# Patient Record
Sex: Male | Born: 1970 | Race: White | Hispanic: No | Marital: Married | Smoking: Never smoker
Health system: Southern US, Community
[De-identification: ages and names within clinical notes are randomized; demographics above are authoritative.]

## PROBLEM LIST (undated history)

## (undated) HISTORY — PX: NECK SURGERY: SHX720

## (undated) HISTORY — PX: TENDON REPAIR: SHX5111

## (undated) HISTORY — PX: APPENDECTOMY: SHX54

---

## 2002-10-05 ENCOUNTER — Inpatient Hospital Stay (HOSPITAL_COMMUNITY): Admit: 2002-10-05 | Discharge: 2002-10-06 | Payer: Self-pay | Admitting: Neurosurgery

## 2017-01-19 DIAGNOSIS — Z6827 Body mass index (BMI) 27.0-27.9, adult: Secondary | ICD-10-CM | POA: Diagnosis not present

## 2017-01-19 DIAGNOSIS — K219 Gastro-esophageal reflux disease without esophagitis: Secondary | ICD-10-CM | POA: Diagnosis not present

## 2017-05-03 MED FILL — PANTOPRAZOLE SOD DR 40 MG T: 40 | 90 days supply | Qty: 90 | Fill #0

## 2017-08-04 DIAGNOSIS — L573 Poikiloderma of Civatte: Secondary | ICD-10-CM | POA: Diagnosis not present

## 2017-08-04 DIAGNOSIS — D485 Neoplasm of uncertain behavior of skin: Secondary | ICD-10-CM | POA: Diagnosis not present

## 2017-08-04 DIAGNOSIS — L570 Actinic keratosis: Secondary | ICD-10-CM | POA: Diagnosis not present

## 2017-12-17 DIAGNOSIS — Z0000 Encounter for general adult medical examination without abnormal findings: Secondary | ICD-10-CM | POA: Diagnosis not present

## 2017-12-22 DIAGNOSIS — Z0001 Encounter for general adult medical examination with abnormal findings: Secondary | ICD-10-CM | POA: Diagnosis not present

## 2017-12-22 DIAGNOSIS — K219 Gastro-esophageal reflux disease without esophagitis: Secondary | ICD-10-CM | POA: Diagnosis not present

## 2017-12-22 DIAGNOSIS — Z6828 Body mass index (BMI) 28.0-28.9, adult: Secondary | ICD-10-CM | POA: Diagnosis not present

## 2018-02-05 DIAGNOSIS — J0100 Acute maxillary sinusitis, unspecified: Secondary | ICD-10-CM | POA: Diagnosis not present

## 2018-02-05 DIAGNOSIS — Z6828 Body mass index (BMI) 28.0-28.9, adult: Secondary | ICD-10-CM | POA: Diagnosis not present

## 2018-03-15 DIAGNOSIS — Z23 Encounter for immunization: Secondary | ICD-10-CM | POA: Diagnosis not present

## 2018-08-03 DIAGNOSIS — D1801 Hemangioma of skin and subcutaneous tissue: Secondary | ICD-10-CM | POA: Diagnosis not present

## 2018-08-03 DIAGNOSIS — L819 Disorder of pigmentation, unspecified: Secondary | ICD-10-CM | POA: Diagnosis not present

## 2018-08-03 DIAGNOSIS — L570 Actinic keratosis: Secondary | ICD-10-CM | POA: Diagnosis not present

## 2018-08-12 DIAGNOSIS — Z6828 Body mass index (BMI) 28.0-28.9, adult: Secondary | ICD-10-CM | POA: Diagnosis not present

## 2018-08-12 DIAGNOSIS — J069 Acute upper respiratory infection, unspecified: Secondary | ICD-10-CM | POA: Diagnosis not present

## 2018-09-19 DIAGNOSIS — M545 Low back pain: Secondary | ICD-10-CM | POA: Diagnosis not present

## 2018-09-19 DIAGNOSIS — Z6829 Body mass index (BMI) 29.0-29.9, adult: Secondary | ICD-10-CM | POA: Diagnosis not present

## 2018-10-03 DIAGNOSIS — B029 Zoster without complications: Secondary | ICD-10-CM | POA: Diagnosis not present

## 2018-10-10 DIAGNOSIS — D696 Thrombocytopenia, unspecified: Secondary | ICD-10-CM | POA: Diagnosis not present

## 2018-12-26 DIAGNOSIS — Z0000 Encounter for general adult medical examination without abnormal findings: Secondary | ICD-10-CM | POA: Diagnosis not present

## 2018-12-30 DIAGNOSIS — E782 Mixed hyperlipidemia: Secondary | ICD-10-CM | POA: Diagnosis not present

## 2018-12-30 DIAGNOSIS — R7301 Impaired fasting glucose: Secondary | ICD-10-CM | POA: Diagnosis not present

## 2018-12-30 DIAGNOSIS — Z0001 Encounter for general adult medical examination with abnormal findings: Secondary | ICD-10-CM | POA: Diagnosis not present

## 2018-12-30 DIAGNOSIS — Z6828 Body mass index (BMI) 28.0-28.9, adult: Secondary | ICD-10-CM | POA: Diagnosis not present

## 2018-12-30 DIAGNOSIS — E8881 Metabolic syndrome: Secondary | ICD-10-CM | POA: Diagnosis not present

## 2018-12-30 DIAGNOSIS — K219 Gastro-esophageal reflux disease without esophagitis: Secondary | ICD-10-CM | POA: Diagnosis not present

## 2019-07-07 DIAGNOSIS — E782 Mixed hyperlipidemia: Secondary | ICD-10-CM | POA: Diagnosis not present

## 2019-07-07 DIAGNOSIS — E8881 Metabolic syndrome: Secondary | ICD-10-CM | POA: Diagnosis not present

## 2019-07-07 DIAGNOSIS — Z6828 Body mass index (BMI) 28.0-28.9, adult: Secondary | ICD-10-CM | POA: Diagnosis not present

## 2019-07-07 DIAGNOSIS — R7301 Impaired fasting glucose: Secondary | ICD-10-CM | POA: Diagnosis not present

## 2019-07-07 DIAGNOSIS — K219 Gastro-esophageal reflux disease without esophagitis: Secondary | ICD-10-CM | POA: Diagnosis not present

## 2020-04-01 ENCOUNTER — Other Ambulatory Visit: Payer: Self-pay

## 2020-04-01 ENCOUNTER — Emergency Department (HOSPITAL_COMMUNITY): Payer: 59

## 2020-04-01 ENCOUNTER — Encounter (HOSPITAL_COMMUNITY): Payer: Self-pay | Admitting: *Deleted

## 2020-04-01 ENCOUNTER — Emergency Department (HOSPITAL_COMMUNITY)
Admission: EM | Admit: 2020-04-01 | Discharge: 2020-04-02 | Disposition: A | Discharge status: Home / Self Care | Payer: 59 | Attending: Emergency Medicine | Admitting: Emergency Medicine

## 2020-04-01 DIAGNOSIS — D239 Other benign neoplasm of skin, unspecified: Secondary | ICD-10-CM | POA: Diagnosis not present

## 2020-04-01 DIAGNOSIS — K5792 Diverticulitis of intestine, part unspecified, without perforation or abscess without bleeding: Secondary | ICD-10-CM | POA: Insufficient documentation

## 2020-04-01 DIAGNOSIS — L570 Actinic keratosis: Secondary | ICD-10-CM | POA: Diagnosis not present

## 2020-04-01 DIAGNOSIS — K659 Peritonitis, unspecified: Secondary | ICD-10-CM | POA: Diagnosis not present

## 2020-04-01 DIAGNOSIS — R1032 Left lower quadrant pain: Secondary | ICD-10-CM | POA: Diagnosis present

## 2020-04-01 DIAGNOSIS — K760 Fatty (change of) liver, not elsewhere classified: Secondary | ICD-10-CM | POA: Diagnosis not present

## 2020-04-01 DIAGNOSIS — Z6827 Body mass index (BMI) 27.0-27.9, adult: Secondary | ICD-10-CM | POA: Diagnosis not present

## 2020-04-01 DIAGNOSIS — I781 Nevus, non-neoplastic: Secondary | ICD-10-CM | POA: Diagnosis not present

## 2020-04-01 LAB — URINALYSIS, ROUTINE W REFLEX MICROSCOPIC
Bilirubin Urine: NEGATIVE
Glucose, UA: NEGATIVE mg/dL
Hgb urine dipstick: NEGATIVE
Ketones, ur: NEGATIVE mg/dL
Leukocytes,Ua: NEGATIVE
Nitrite: NEGATIVE
Protein, ur: NEGATIVE mg/dL
Specific Gravity, Urine: 1.016 (ref 1.005–1.030)
pH: 7 (ref 5.0–8.0)

## 2020-04-01 LAB — COMPREHENSIVE METABOLIC PANEL
ALT: 37 U/L (ref 0–44)
AST: 29 U/L (ref 15–41)
Albumin: 4.4 g/dL (ref 3.5–5.0)
Alkaline Phosphatase: 73 U/L (ref 38–126)
Anion gap: 10 (ref 5–15)
BUN: 13 mg/dL (ref 6–20)
CO2: 26 mmol/L (ref 22–32)
Calcium: 9.6 mg/dL (ref 8.9–10.3)
Chloride: 100 mmol/L (ref 98–111)
Creatinine, Ser: 1 mg/dL (ref 0.61–1.24)
GFR calc Af Amer: >60 mL/min (ref >60)
GFR calc non Af Amer: >60 mL/min (ref >60)
Glucose, Bld: 111 mg/dL — ABNORMAL HIGH (ref 70–99)
Potassium: 4 mmol/L (ref 3.5–5.1)
Sodium: 136 mmol/L (ref 135–145)
Total Bilirubin: 1.5 mg/dL — ABNORMAL HIGH (ref 0.3–1.2)
Total Protein: 7.9 g/dL (ref 6.5–8.1)

## 2020-04-01 LAB — CBC
HCT: 48.9 % (ref 39.0–52.0)
Hemoglobin: 17 g/dL (ref 13.0–17.0)
MCH: 31.3 pg (ref 26.0–34.0)
MCHC: 34.8 g/dL (ref 30.0–36.0)
MCV: 89.9 fL (ref 80.0–100.0)
Platelets: 125 10*3/uL — ABNORMAL LOW (ref 150–400)
RBC: 5.44 MIL/uL (ref 4.22–5.81)
RDW: 12.6 % (ref 11.5–15.5)
WBC: 8.5 10*3/uL (ref 4.0–10.5)
nRBC: 0 % (ref 0.0–0.2)

## 2020-04-01 LAB — LIPASE, BLOOD: Lipase: 41 U/L (ref 11–51)

## 2020-04-01 MED ORDER — IOHEXOL 300 MG/ML  SOLN
100.0000 mL | Freq: Once | INTRAMUSCULAR | Status: AC | PRN
  Administered 2020-04-01: 100 mL via INTRAVENOUS

## 2020-04-01 MED ORDER — AMOXICILLIN-POT CLAVULANATE 875-125 MG PO TABS
1.0000 | ORAL_TABLET | Freq: Once | ORAL | Status: AC
  Administered 2020-04-01: 1 via ORAL
  Filled 2020-04-01: qty 1

## 2020-04-01 MED ORDER — MORPHINE SULFATE (PF) 4 MG/ML IV SOLN
4.0000 mg | Freq: Once | INTRAVENOUS | Status: AC
  Administered 2020-04-01: 4 mg via INTRAVENOUS
  Filled 2020-04-01: qty 1

## 2020-04-01 MED ORDER — ONDANSETRON HCL 4 MG/2ML IJ SOLN
4.0000 mg | Freq: Once | INTRAMUSCULAR | Status: AC
  Administered 2020-04-01: 4 mg via INTRAVENOUS
  Filled 2020-04-01: qty 2

## 2020-04-01 NOTE — ED Triage Notes (Signed)
Pt c/o umbilical pain x 3 days; pt has had diarrhea

## 2020-04-02 MED ORDER — OXYCODONE-ACETAMINOPHEN 5-325 MG PO TABS: 1.0000 | ORAL_TABLET | Freq: Four times a day (QID) | ORAL | 0 refills | Status: DC | PRN

## 2020-04-02 MED ORDER — ONDANSETRON 4 MG PO TBDP: 4.0000 mg | ORAL_TABLET | Freq: Three times a day (TID) | ORAL | 0 refills | Status: AC | PRN

## 2020-04-02 MED ORDER — AMOXICILLIN-POT CLAVULANATE 875-125 MG PO TABS: 1.0000 | ORAL_TABLET | Freq: Two times a day (BID) | ORAL | 0 refills | Status: DC

## 2020-04-02 NOTE — Discharge Instructions (Addendum)
You were seen today and found to have diverticulitis.  Take antibiotic as prescribed.  You may take pain and nausea medication.  Do not drive while taking pain medication.  If you develop fevers, worsening pain, any new or worsening symptoms you should be reevaluated.

## 2020-04-02 NOTE — ED Provider Notes (Signed)
[facility] EMERGENCY DEPARTMENT Provider Note   CSN: [id number] Arrival date & time: 04/01/20  1951     History Chief Complaint  Patient presents with  . Abdominal Pain    Gregory Crawford is a 49 y.o. male.  HPI     This is a 49 year old male with a history of appendectomy who presents with abdominal pain.  Patient reports he started feeling poorly on Friday.  He had progressive pain over the weekend in his lower abdomen left lower quadrant.  No noted fevers.  He states he has had some diarrhea.  No bloody stools.  He reports nausea without vomiting.  He was seen and evaluated by his primary physician today and have some concern for diverticulitis.  He denies any other systemic symptoms including chest pain, shortness of breath.  No known sick contacts or Covid exposures.  History reviewed. No pertinent past medical history.  There are no problems to display for this patient.   Past Surgical History:  Procedure Laterality Date  . APPENDECTOMY    . TENDON REPAIR Left        History reviewed. No pertinent family history.  Social History   Tobacco Use  . Smoking status: Never Smoker  . Smokeless tobacco: Never Used  Vaping Use  . Vaping Use: Never used  Substance Use Topics  . Alcohol use: Yes    Comment: occasionally  . Drug use: Not Currently    Home Medications Prior to Admission medications   Not on File    Allergies    Patient has no known allergies.  Review of Systems   Review of Systems  Constitutional: Negative for fever.  Respiratory: Negative for shortness of breath.   Cardiovascular: Negative for chest pain.  Gastrointestinal: Positive for abdominal pain, diarrhea and nausea. Negative for blood in stool, constipation and vomiting.  Genitourinary: Negative for dysuria.  All other systems reviewed and are negative.   Physical Exam Updated Vital Signs BP (!) 150/99   Pulse 67   Temp 99 F (37.2 C) (Oral)   Resp 16   Ht 1.829 m (6')    Wt 95.3 kg   SpO2 90%   BMI 28.48 kg/m   Physical Exam Vitals and nursing note reviewed.  Constitutional:      Appearance: He is well-developed. He is not ill-appearing.  HENT:     Head: Normocephalic and atraumatic.     Mouth/Throat:     Mouth: Mucous membranes are moist.  Eyes:     Pupils: Pupils are equal, round, and reactive to light.  Cardiovascular:     Rate and Rhythm: Normal rate and regular rhythm.     Heart sounds: Normal heart sounds. No murmur heard.   Pulmonary:     Effort: Pulmonary effort is normal. No respiratory distress.     Breath sounds: Normal breath sounds. No wheezing.  Abdominal:     General: Bowel sounds are normal.     Palpations: Abdomen is soft.     Tenderness: There is abdominal tenderness in the left lower quadrant. There is no guarding or rebound.  Musculoskeletal:     Cervical back: Neck supple.  Lymphadenopathy:     Cervical: No cervical adenopathy.  Skin:    General: Skin is warm and dry.  Neurological:     Mental Status: He is alert and oriented to person, place, and time.  Psychiatric:        Mood and Affect: Mood normal.     ED Results /  Procedures / Treatments   Labs (all labs ordered are listed, but only abnormal results are displayed) Labs Reviewed  COMPREHENSIVE METABOLIC PANEL - Abnormal; Notable for the following components:      Result Value   Glucose, Bld 111 (*)    Total Bilirubin 1.5 (*)    All other components within normal limits  CBC - Abnormal; Notable for the following components:   Platelets 125 (*)    All other components within normal limits  LIPASE, BLOOD  URINALYSIS, ROUTINE W REFLEX MICROSCOPIC    EKG None  Radiology CT Abdomen Pelvis W Contrast  Result Date: 04/01/2020 CLINICAL DATA:  Diverticulitis. EXAM: CT ABDOMEN AND PELVIS WITH CONTRAST TECHNIQUE: Multidetector CT imaging of the abdomen and pelvis was performed using the standard protocol following bolus administration of intravenous  contrast. CONTRAST:  120mL OMNIPAQUE IOHEXOL 300 MG/ML  SOLN COMPARISON:  None. FINDINGS: Lower chest: The lung bases are clear. The heart size is normal. Hepatobiliary: There is decreased hepatic attenuation suggestive of hepatic steatosis. Normal gallbladder.There is no biliary ductal dilation. Pancreas: Normal contours without ductal dilatation. No peripancreatic fluid collection. Spleen: Unremarkable. Adrenals/Urinary Tract: --Adrenal glands: Unremarkable. --Right kidney/ureter: No hydronephrosis or radiopaque kidney stones. --Left kidney/ureter: No hydronephrosis or radiopaque kidney stones. --Urinary bladder: Unremarkable. Stomach/Bowel: --Stomach/Duodenum: No hiatal hernia or other gastric abnormality. Normal duodenal course and caliber. --Small bowel: Unremarkable. --Colon: There is a single diverticulum at the level of the sigmoid colon with surrounding inflammatory changes and adjacent circumferential wall thickening of the affected sigmoid colon. There is no adjacent fluid collection or free air. There is liquid stool within the right hemicolon. --Appendix: Not visualized. No right lower quadrant inflammation or free fluid. Vascular/Lymphatic: Normal course and caliber of the major abdominal vessels. --No retroperitoneal lymphadenopathy. --No mesenteric lymphadenopathy. --No pelvic or inguinal lymphadenopathy. Reproductive: Unremarkable Other: No ascites or free air. The abdominal wall is normal. Musculoskeletal. No acute displaced fractures. IMPRESSION: 1. Acute uncomplicated sigmoid diverticulitis. 2. Hepatic steatosis. Electronically Signed   By: Constance Holster M.D.   On: 04/01/2020 23:31    Procedures Procedures (including critical care time)  Medications Ordered in ED Medications  iohexol (OMNIPAQUE) 300 MG/ML solution 100 mL (100 mLs Intravenous Contrast Given 04/01/20 2313)  morphine 4 MG/ML injection 4 mg (4 mg Intravenous Given 04/01/20 2346)  ondansetron (ZOFRAN) injection 4 mg (4 mg  Intravenous Given 04/01/20 2347)  amoxicillin-clavulanate (AUGMENTIN) 875-125 MG per tablet 1 tablet (1 tablet Oral Given 04/01/20 2344)    ED Course  I have reviewed the triage vital signs and the nursing notes.  Pertinent labs & imaging results that were available during my care of the patient were reviewed by me and considered in my medical decision making (see chart for details).    MDM Rules/Calculators/A&P                          Patient presents with abdominal pain.  Overall nontoxic.  He is afebrile.  Slightly hypertensive at 150/99.  Sent from primary office with concern for diverticulitis.  He is clinically non-ill-appearing.  He does have some tenderness in left lower quadrant.  CT scan ordered from triage reviewed and shows evidence of uncomplicated diverticulitis.  Patient was given pain and nausea medication as well as Augmentin.  Lab work reviewed.  No significant metabolic derangements.  No significant leukocytosis.  Patient is able to orally hydrate.  Feel he is candidate for outpatient treatment.  Will discharge with a short  course of pain medication and Augmentin.  He was given strict return precautions.  After history, exam, and medical workup I feel the patient has been appropriately medically screened and is safe for discharge home. Pertinent diagnoses were discussed with the patient. Patient was given return precautions.   Final Clinical Impression(s) / ED Diagnoses Final diagnoses:  Diverticulitis    Rx / DC Orders ED Discharge Orders    None       Cashis Rill, Barbette Hair, MD 04/02/20 0100

## 2020-04-15 DIAGNOSIS — K5792 Diverticulitis of intestine, part unspecified, without perforation or abscess without bleeding: Secondary | ICD-10-CM | POA: Diagnosis not present

## 2020-04-15 DIAGNOSIS — Z6828 Body mass index (BMI) 28.0-28.9, adult: Secondary | ICD-10-CM | POA: Diagnosis not present

## 2020-04-17 ENCOUNTER — Encounter (INDEPENDENT_AMBULATORY_CARE_PROVIDER_SITE_OTHER): Payer: Self-pay | Admitting: *Deleted

## 2020-05-09 ENCOUNTER — Encounter (INDEPENDENT_AMBULATORY_CARE_PROVIDER_SITE_OTHER): Payer: Self-pay

## 2020-05-09 ENCOUNTER — Other Ambulatory Visit (HOSPITAL_COMMUNITY): Payer: Self-pay | Admitting: Gastroenterology

## 2020-05-09 ENCOUNTER — Telehealth (INDEPENDENT_AMBULATORY_CARE_PROVIDER_SITE_OTHER): Payer: Self-pay

## 2020-05-09 ENCOUNTER — Other Ambulatory Visit (INDEPENDENT_AMBULATORY_CARE_PROVIDER_SITE_OTHER): Payer: Self-pay

## 2020-05-09 DIAGNOSIS — Z1211 Encounter for screening for malignant neoplasm of colon: Secondary | ICD-10-CM

## 2020-05-09 MED ORDER — NA SULFATE-K SULFATE-MG SULF 17.5-3.13-1.6 GM/177ML PO SOLN: 1.0000 | Freq: Once | ORAL | 0 refills | Status: AC

## 2020-05-09 MED FILL — SUPREP BOWEL PREP KIT: 17.5-3.13-1 | 1 days supply | Qty: 354 | Fill #0

## 2020-05-09 NOTE — Telephone Encounter (Signed)
Referring MD/PCP: Quillian Quince   Procedure: Tcs w/mac  Reason/Indication:  Screening  Has patient had this procedure before?  no  If so, when, by whom and where?    Is there a family history of colon cancer?  no  Who?  What age when diagnosed?    Is patient diabetic?   no      Does patient have prosthetic heart valve or mechanical valve?  no  Do you have a pacemaker/defibrillator?  no  Has patient ever had endocarditis/atrial fibrillation? no  Does patient use oxygen? no  Has patient had joint replacement within last 12 months?  no  Is patient constipated or do they take laxatives? no  Does patient have a history of alcohol/drug use?  no  Is patient on blood thinner such as Coumadin, Plavix and/or Aspirin? no  Medications: none  Allergies: nkda  Medication Adjustment per Dr Jenetta Downer none  Procedure date & time: 05/15/20 8:15

## 2020-05-09 NOTE — Telephone Encounter (Signed)
Ok to schedule.  Thanks,  Mel Langan Castaneda Mayorga, MD Gastroenterology and Hepatology [facility]  

## 2020-05-09 NOTE — Telephone Encounter (Signed)
Bowel prep

## 2020-05-13 ENCOUNTER — Other Ambulatory Visit: Payer: Self-pay

## 2020-05-13 ENCOUNTER — Other Ambulatory Visit (HOSPITAL_COMMUNITY)
Admission: RE | Admit: 2020-05-13 | Discharge: 2020-05-13 | Disposition: A | Discharge status: Home / Self Care | Payer: 59 | Source: Ambulatory Visit | Attending: Gastroenterology | Admitting: Gastroenterology

## 2020-05-13 DIAGNOSIS — Z01812 Encounter for preprocedural laboratory examination: Secondary | ICD-10-CM | POA: Insufficient documentation

## 2020-05-13 DIAGNOSIS — Z20822 Contact with and (suspected) exposure to covid-19: Secondary | ICD-10-CM | POA: Insufficient documentation

## 2020-05-13 LAB — SARS CORONAVIRUS 2 (TAT 6-24 HRS): SARS Coronavirus 2: NEGATIVE

## 2020-05-15 ENCOUNTER — Other Ambulatory Visit: Payer: Self-pay

## 2020-05-15 ENCOUNTER — Ambulatory Visit (HOSPITAL_COMMUNITY): Payer: 59 | Admitting: Anesthesiology

## 2020-05-15 ENCOUNTER — Encounter (HOSPITAL_COMMUNITY): Payer: Self-pay | Admitting: Gastroenterology

## 2020-05-15 ENCOUNTER — Encounter (HOSPITAL_COMMUNITY)
Admission: RE | Disposition: A | Discharge status: Home / Self Care | Payer: Self-pay | Source: Home / Self Care | Attending: Gastroenterology

## 2020-05-15 ENCOUNTER — Ambulatory Visit (HOSPITAL_COMMUNITY)
Admission: RE | Admit: 2020-05-15 | Discharge: 2020-05-15 | Disposition: A | Discharge status: Home / Self Care | Payer: 59 | Attending: Gastroenterology | Admitting: Gastroenterology

## 2020-05-15 DIAGNOSIS — K648 Other hemorrhoids: Secondary | ICD-10-CM | POA: Diagnosis not present

## 2020-05-15 DIAGNOSIS — D125 Benign neoplasm of sigmoid colon: Secondary | ICD-10-CM

## 2020-05-15 DIAGNOSIS — K635 Polyp of colon: Secondary | ICD-10-CM | POA: Insufficient documentation

## 2020-05-15 DIAGNOSIS — Z1211 Encounter for screening for malignant neoplasm of colon: Secondary | ICD-10-CM | POA: Diagnosis not present

## 2020-05-15 DIAGNOSIS — K5730 Diverticulosis of large intestine without perforation or abscess without bleeding: Secondary | ICD-10-CM | POA: Insufficient documentation

## 2020-05-15 HISTORY — PX: COLONOSCOPY WITH PROPOFOL: SHX5780

## 2020-05-15 HISTORY — PX: POLYPECTOMY: SHX5525

## 2020-05-15 LAB — HM COLONOSCOPY

## 2020-05-15 SURGERY — COLONOSCOPY WITH PROPOFOL
Anesthesia: General

## 2020-05-15 MED ORDER — PROPOFOL 10 MG/ML IV BOLUS
INTRAVENOUS | Status: DC | PRN
  Administered 2020-05-15: 100 mg via INTRAVENOUS

## 2020-05-15 MED ORDER — PROPOFOL 500 MG/50ML IV EMUL
INTRAVENOUS | Status: DC | PRN
  Administered 2020-05-15: 150 ug/kg/min via INTRAVENOUS

## 2020-05-15 MED ORDER — LACTATED RINGERS IV SOLN: Freq: Once | INTRAVENOUS | Status: AC

## 2020-05-15 MED ORDER — CHLORHEXIDINE GLUCONATE CLOTH 2 % EX PADS: 6.0000 | MEDICATED_PAD | Freq: Once | CUTANEOUS | Status: DC

## 2020-05-15 MED ORDER — LACTATED RINGERS IV SOLN: INTRAVENOUS | Status: DC | PRN

## 2020-05-15 MED ORDER — PROPOFOL 10 MG/ML IV BOLUS
INTRAVENOUS | Status: AC
  Filled 2020-05-15: qty 80

## 2020-05-15 NOTE — H&P (Signed)
Vrishank Moster is an 49 y.o. male.   Chief Complaint: Screening colonoscopy HPI: 49 year old male with past medical history of recent diverticulitis 6 weeks ago, who comes to the hospital to undergo screening colonoscopy.  The patient has never had a colonoscopy in the past.  She denies having any complaints such as melena, hematochezia,  or distention, change in her bowel movement consistency or frequency, no changes in her weight recently.  No family history of colorectal cancer.  Notably, the patient was diagnosed with acute diverticulitis on 04/01/2020 for which he received a course of Augmentin with resolution of his symptoms.  He currently endorses having occasional right sided abdominal pain but denies having any other complaints.  History reviewed. No pertinent past medical history.  Past Surgical History:  Procedure Laterality Date  . APPENDECTOMY    . NECK SURGERY    . TENDON REPAIR Left     History reviewed. No pertinent family history. Social History:  reports that he has never smoked. He has never used smokeless tobacco. He reports current alcohol use. He reports previous drug use.  Allergies: No Known Allergies  Medications Prior to Admission  Medication Sig Dispense Refill  . acetaminophen (TYLENOL) 500 MG tablet Take 1,000 mg by mouth every 6 (six) hours as needed for moderate pain or headache.    . ibuprofen (ADVIL) 200 MG tablet Take 400-800 mg by mouth every 6 (six) hours as needed for headache or moderate pain.    . Multiple Vitamin (MULTIVITAMIN WITH MINERALS) TABS tablet Take 1 tablet by mouth daily.    Marland Kitchen amoxicillin-clavulanate (AUGMENTIN) 875-125 MG tablet Take 1 tablet by mouth every 12 (twelve) hours. (Patient not taking: Reported on 05/09/2020) 20 tablet 0  . ondansetron (ZOFRAN ODT) 4 MG disintegrating tablet Take 1 tablet (4 mg total) by mouth every 8 (eight) hours as needed for nausea or vomiting. (Patient not taking: Reported on 05/09/2020) 20 tablet 0  .  oxyCODONE-acetaminophen (PERCOCET/ROXICET) 5-325 MG tablet Take 1 tablet by mouth every 6 (six) hours as needed for severe pain. (Patient not taking: Reported on 05/09/2020) 15 tablet 0    Results for orders placed or performed during the hospital encounter of 05/13/20 (from the past 48 hour(s))  SARS CORONAVIRUS 2 (TAT 6-24 HRS) Nasopharyngeal Nasopharyngeal Swab     Status: None   Collection Time: 05/13/20  8:35 AM   Specimen: Nasopharyngeal Swab  Result Value Ref Range   SARS Coronavirus 2 NEGATIVE NEGATIVE    Comment: (NOTE) SARS-CoV-2 target nucleic acids are NOT DETECTED.  The SARS-CoV-2 RNA is generally detectable in upper and lower respiratory specimens during the acute phase of infection. Negative results do not preclude SARS-CoV-2 infection, do not rule out co-infections with other pathogens, and should not be used as the sole basis for treatment or other patient management decisions. Negative results must be combined with clinical observations, patient history, and epidemiological information. The expected result is Negative.  Fact Sheet for Patients: SugarRoll.be  Fact Sheet for Healthcare Providers: https://www.woods-mathews.com/  This test is not yet approved or cleared by the [place] FDA and  has been authorized for detection and/or diagnosis of SARS-CoV-2 by FDA under an Emergency Use Authorization (EUA). This EUA will remain  in effect (meaning this test can be used) for the duration of the COVID-19 declaration under Se ction 564(b)(1) of the Act, 21 U.S.C. section 360bbb-3(b)(1), unless the authorization is terminated or revoked sooner.  Performed at [facility], [place],   [address]    No results found.  Review of Systems  Constitutional: Negative.   HENT: Negative.   Eyes: Negative.   Respiratory: Negative.   Cardiovascular: Negative.   Gastrointestinal: Positive for abdominal  pain.  Endocrine: Negative.   Genitourinary: Negative.   Musculoskeletal: Negative.   Skin: Negative.   Allergic/Immunologic: Negative.   Neurological: Negative.   Hematological: Negative.   Psychiatric/Behavioral: Negative.     Blood pressure (!) 145/99, pulse 62, temperature 98.1 F (36.7 C), temperature source Oral, resp. rate 17, SpO2 96 %. Physical Exam  GENERAL: The patient is AO x3, in no acute distress. HEENT: Head is normocephalic and atraumatic. EOMI are intact. Mouth is well hydrated and without lesions. NECK: Supple. No masses LUNGS: Clear to auscultation. No presence of rhonchi/wheezing/rales. Adequate chest expansion HEART: RRR, normal s1 and s2. ABDOMEN: mildly tender upon palpation of R flank, no guarding, no peritoneal signs, and nondistended. BS +. No masses. EXTREMITIES: Without any cyanosis, clubbing, rash, lesions or edema. NEUROLOGIC: AOx3, no focal motor deficit. SKIN: no jaundice, no rashes  Assessment/Plan 49 year old male with past medical history of recent diverticulitis 6 weeks ago, who comes to the hospital to undergo screening colonoscopy.  Patient is at average risk for colorectal cancer but had recent episode of diverticulitis treated adequately with antibiotics.  We will proceed with colonoscopy today but patient was advised that if there is ongoing inflammation the procedure will be aborted. Patient understood and agreed.   Harvel Quale, MD 05/15/2020, 7:31 AM

## 2020-05-15 NOTE — Transfer of Care (Signed)
Immediate Anesthesia Transfer of Care Note  Patient: Gregory Crawford  Procedure(s) Performed: COLONOSCOPY WITH PROPOFOL (N/A ) POLYPECTOMY  Patient Location: PACU  Anesthesia Type:General  Level of Consciousness: awake, alert , oriented and patient cooperative  Airway & Oxygen Therapy: Patient Spontanous Breathing  Post-op Assessment: Report given to RN, Post -op Vital signs reviewed and stable and Patient moving all extremities X 4  Post vital signs: Reviewed and stable  Last Vitals:  Vitals Value Taken Time  BP    Temp    Pulse    Resp    SpO2      Last Pain:  Vitals:   05/15/20 0806  TempSrc:   PainSc: 0-No pain      Patients Stated Pain Goal: 7 (29/56/21 3086)  Complications: No complications documented.

## 2020-05-15 NOTE — Anesthesia Preprocedure Evaluation (Addendum)
Anesthesia Evaluation  Patient identified by MRN, date of birth, ID band Patient awake    Reviewed: Allergy & Precautions, NPO status , Patient's Chart, lab work & pertinent test results  History of Anesthesia Complications Negative for: history of anesthetic complications  Airway Mallampati: III  TM Distance: >3 FB Neck ROM: Full    Dental  (+) Dental Advisory Given, Teeth Intact   Pulmonary neg pulmonary ROS,    Pulmonary exam normal breath sounds clear to auscultation       Cardiovascular Exercise Tolerance: Good Normal cardiovascular exam Rhythm:Regular Rate:Normal     Neuro/Psych negative neurological ROS  negative psych ROS   GI/Hepatic negative GI ROS, Neg liver ROS,   Endo/Other  negative endocrine ROS  Renal/GU negative Renal ROS  negative genitourinary   Musculoskeletal negative musculoskeletal ROS (+)   Abdominal   Peds  Hematology negative hematology ROS (+)   Anesthesia Other Findings   Reproductive/Obstetrics negative OB ROS                            Anesthesia Physical Anesthesia Plan  ASA: I  Anesthesia Plan: General   Post-op Pain Management:    Induction: Intravenous  PONV Risk Score and Plan: TIVA  Airway Management Planned: Nasal Cannula, Natural Airway and Simple Face Mask  Additional Equipment:   Intra-op Plan:   Post-operative Plan:   Informed Consent: I have reviewed the patients History and Physical, chart, labs and discussed the procedure including the risks, benefits and alternatives for the proposed anesthesia with the patient or authorized representative who has indicated his/her understanding and acceptance.     Dental advisory given  Plan Discussed with: CRNA and Surgeon  Anesthesia Plan Comments:        Anesthesia Quick Evaluation

## 2020-05-15 NOTE — Anesthesia Postprocedure Evaluation (Signed)
Anesthesia Post Note  Patient: Gregory Crawford  Procedure(s) Performed: COLONOSCOPY WITH PROPOFOL (N/A ) POLYPECTOMY  Patient location during evaluation: Phase II Anesthesia Type: General Level of consciousness: awake, oriented, awake and alert and patient cooperative Pain management: satisfactory to patient Vital Signs Assessment: post-procedure vital signs reviewed and stable Respiratory status: spontaneous breathing, respiratory function stable and nonlabored ventilation Cardiovascular status: stable Postop Assessment: no apparent nausea or vomiting Anesthetic complications: no   No complications documented.   Last Vitals:  Vitals:   05/15/20 0718 05/15/20 0845  BP: (!) 145/99 104/64  Pulse: 62   Resp: 17 18  Temp: 36.7 C (!) 36.3 C  SpO2: 96% 93%    Last Pain:  Vitals:   05/15/20 0845  TempSrc: Axillary  PainSc:                  Willa Rough

## 2020-05-15 NOTE — Op Note (Signed)
[facility] Patient Name: Gregory Crawford Procedure Date: 05/15/2020 7:58 AM MRN: [id number] Date of Birth: May 01, 1971 Attending MD: Maylon Peppers ,  CSN: [id number] Age: 49 Admit Type: Outpatient Procedure:                Colonoscopy Indications:              Screening for colorectal malignant neoplasm Providers:                Maylon Peppers, [place] Sharon Seller, RN, Dereck Leep, Technician, Nelma Rothman, Technician Referring MD:              Medicines:                Monitored Anesthesia Care Complications:            No immediate complications. Estimated Blood Loss:     Estimated blood loss: none. Procedure:                Pre-Anesthesia Assessment:                           - Prior to the procedure, a History and Physical                            was performed, and patient medications, allergies                            and sensitivities were reviewed. The patient's                            tolerance of previous anesthesia was reviewed.                           - The risks and benefits of the procedure and the                            sedation options and risks were discussed with the                            patient. All questions were answered and informed                            consent was obtained.                           - ASA Grade Assessment: II - A patient with mild                            systemic disease.                           After obtaining informed consent, the colonoscope                            was passed under direct  vision. Throughout the                            procedure, the patient's blood pressure, pulse, and                            oxygen saturations were monitored continuously. The                            [id number]-[id number] ([id number]) scope was introduced through                            the anus and advanced to the the cecum, identified                            by appendiceal  orifice and ileocecal valve. The                            colonoscopy was performed without difficulty. The                            patient tolerated the procedure well. Scope In: 8:11:21 AM Scope Out: 8:40:44 AM Scope Withdrawal Time: 0 hours 19 minutes 0 seconds  Total Procedure Duration: 0 hours 29 minutes 23 seconds  Findings:      The perianal and digital rectal examinations were normal.      A few small and large-mouthed diverticula were found in the sigmoid       colon, descending colon and ascending colon.      A 2 mm polyp was found in the sigmoid colon. The polyp was sessile. The       polyp was removed with a cold biopsy forceps. Resection and retrieval       were complete.      Non-bleeding internal hemorrhoids were found during retroflexion. The       hemorrhoids were small. Impression:               - Diverticulosis in the sigmoid colon, in the                            descending colon and in the ascending colon.                           - One 2 mm polyp in the sigmoid colon, removed with                            a cold biopsy forceps. Resected and retrieved.                           - Non-bleeding internal hemorrhoids. Moderate Sedation:      Per Anesthesia Care Recommendation:           - Discharge patient to home (ambulatory).                           - High fiber diet.                           -  Repeat colonoscopy date to be determined after                            pending pathology results are reviewed for                            screening purposes.                           - Await pathology results. Procedure Code(s):        --- Professional ---                           [phone], GC, Colonoscopy, flexible; with biopsy,                            single or multiple Diagnosis Code(s):        --- Professional ---                           Z12.11, Encounter for screening for malignant                            neoplasm of colon                            K64.8, Other hemorrhoids                           K63.5, Polyp of colon                           K57.30, Diverticulosis of large intestine without                            perforation or abscess without bleeding CPT copyright 2019 American Medical Association. All rights reserved. The codes documented in this report are preliminary and upon coder review may  be revised to meet current compliance requirements. Maylon Peppers, MD Maylon Peppers,  05/15/2020 8:47:34 AM This report has been signed electronically. Number of Addenda: 0

## 2020-05-15 NOTE — Discharge Instructions (Signed)
You are being discharged to home.  Eat a high fiber diet.  Your physician has recommended a repeat colonoscopy (date to be determined after pending pathology results are reviewed) for screening purposes.  We are waiting for your pathology results.    Diverticulosis  Diverticulosis is a condition that develops when small pouches (diverticula) form in the wall of the large intestine (colon). The colon is where water is absorbed and stool (feces) is formed. The pouches form when the inside layer of the colon pushes through weak spots in the outer layers of the colon. You may have a few pouches or many of them. The pouches usually do not cause problems unless they become inflamed or infected. When this happens, the condition is called diverticulitis. What are the causes? The cause of this condition is not known. What increases the risk? The following factors may make you more likely to develop this condition:  Being older than age 73. Your risk for this condition increases with age. Diverticulosis is rare among people younger than age 73. By age 92, many people have it.  Eating a low-fiber diet.  Having frequent constipation.  Being overweight.  Not getting enough exercise.  Smoking.  Taking over-the-counter pain medicines, like aspirin and ibuprofen.  Having a family history of diverticulosis. What are the signs or symptoms? In most people, there are no symptoms of this condition. If you do have symptoms, they may include:  Bloating.  Cramps in the abdomen.  Constipation or diarrhea.  Pain in the lower left side of the abdomen. How is this diagnosed? Because diverticulosis usually has no symptoms, it is most often diagnosed during an exam for other colon problems. The condition may be diagnosed by:  Using a flexible scope to examine the colon (colonoscopy).  Taking an X-ray of the colon after dye has been put into the colon (barium enema).  Having a CT scan. How is this  treated? You may not need treatment for this condition. Your health care provider may recommend treatment to prevent problems. You may need treatment if you have symptoms or if you previously had diverticulitis. Treatment may include:  Eating a high-fiber diet.  Taking a fiber supplement.  Taking a live bacteria supplement (probiotic).  Taking medicine to relax your colon. Follow these instructions at home: Medicines  Take over-the-counter and prescription medicines only as told by your health care provider.  If told by your health care provider, take a fiber supplement or probiotic. Constipation prevention Your condition may cause constipation. To prevent or treat constipation, you may need to:  Drink enough fluid to keep your urine pale yellow.  Take over-the-counter or prescription medicines.  Eat foods that are high in fiber, such as beans, whole grains, and fresh fruits and vegetables.  Limit foods that are high in fat and processed sugars, such as fried or sweet foods.  General instructions  Try not to strain when you have a bowel movement.  Keep all follow-up visits as told by your health care provider. This is important. Contact a health care provider if you:  Have pain in your abdomen.  Have bloating.  Have cramps.  Have not had a bowel movement in 3 days. Get help right away if:  Your pain gets worse.  Your bloating becomes very bad.  You have a fever or chills, and your symptoms suddenly get worse.  You vomit.  You have bowel movements that are bloody or black.  You have bleeding from your rectum. Summary  Diverticulosis is a condition that develops when small pouches (diverticula) form in the wall of the large intestine (colon).  You may have a few pouches or many of them.  This condition is most often diagnosed during an exam for other colon problems.  Treatment may include increasing the fiber in your diet, taking supplements, or taking  medicines. This information is not intended to replace advice given to you by your health care provider. Make sure you discuss any questions you have with your health care provider. Document Revised: 01/12/2019 Document Reviewed: 01/12/2019 Elsevier Patient Education  [place].   Colon Polyps  Polyps are tissue growths inside the body. Polyps can grow in many places, including the large intestine (colon). A polyp may be a round bump or a mushroom-shaped growth. You could have one polyp or several. Most colon polyps are noncancerous (benign). However, some colon polyps can become cancerous over time. Finding and removing the polyps early can help prevent this. What are the causes? The exact cause of colon polyps is not known. What increases the risk? You are more likely to develop this condition if you:  Have a family history of colon cancer or colon polyps.  Are older than 66 or older than 45 if you are African American.  Have inflammatory bowel disease, such as ulcerative colitis or Crohn's disease.  Have certain hereditary conditions, such as: ? Familial adenomatous polyposis. ? Lynch syndrome. ? Turcot syndrome. ? Peutz-Jeghers syndrome.  Are overweight.  Smoke cigarettes.  Do not get enough exercise.  Drink too much alcohol.  Eat a diet that is high in fat and red meat and low in fiber.  Had childhood cancer that was treated with abdominal radiation. What are the signs or symptoms? Most polyps do not cause symptoms. If you have symptoms, they may include:  Blood coming from your rectum when having a bowel movement.  Blood in your stool. The stool may look dark red or black.  Abdominal pain.  A change in bowel habits, such as constipation or diarrhea. How is this diagnosed? This condition is diagnosed with a colonoscopy. This is a procedure in which a lighted, flexible scope is inserted into the anus and then passed into the colon to examine the area.  Polyps are sometimes found when a colonoscopy is done as part of routine cancer screening tests. How is this treated? Treatment for this condition involves removing any polyps that are found. Most polyps can be removed during a colonoscopy. Those polyps will then be tested for cancer. Additional treatment may be needed depending on the results of testing. Follow these instructions at home: Lifestyle  Maintain a healthy weight, or lose weight if recommended by your health care provider.  Exercise every day or as told by your health care provider.  Do not use any products that contain nicotine or tobacco, such as cigarettes and e-cigarettes. If you need help quitting, ask your health care provider.  If you drink alcohol, limit how much you have: ? 0-1 drink a day for women. ? 0-2 drinks a day for men.  Be aware of how much alcohol is in your drink. In the [place] one drink equals one 12 oz bottle of beer (355 mL), one 5 oz glass of wine (148 mL), or one 1 oz shot of hard liquor (44 mL). Eating and drinking   Eat foods that are high in fiber, such as fruits, vegetables, and whole grains.  Eat foods that are high in calcium  and vitamin D, such as milk, cheese, yogurt, eggs, liver, fish, and broccoli.  Limit foods that are high in fat, such as fried foods and desserts.  Limit the amount of red meat and processed meat you eat, such as hot dogs, sausage, bacon, and lunch meats. General instructions  Keep all follow-up visits as told by your health care provider. This is important. ? This includes having regularly scheduled colonoscopies. ? Talk to your health care provider about when you need a colonoscopy. Contact a health care provider if:  You have new or worsening bleeding during a bowel movement.  You have new or increased blood in your stool.  You have a change in bowel habits.  You lose weight for no known reason. Summary  Polyps are tissue growths inside the body. Polyps  can grow in many places, including the colon.  Most colon polyps are noncancerous (benign), but some can become cancerous over time.  This condition is diagnosed with a colonoscopy.  Treatment for this condition involves removing any polyps that are found. Most polyps can be removed during a colonoscopy. This information is not intended to replace advice given to you by your health care provider. Make sure you discuss any questions you have with your health care provider. Document Revised: 09/30/2017 Document Reviewed: 09/30/2017 Elsevier Patient Education  [place].  Colonoscopy, Adult, Care After This sheet gives you information about how to care for yourself after your procedure. Your doctor may also give you more specific instructions. If you have problems or questions, call your doctor. What can I expect after the procedure? After the procedure, it is common to have:  A small amount of blood in your poop (stool) for 24 hours.  Some gas.  Mild cramping or bloating in your belly (abdomen). Follow these instructions at home: Eating and drinking   Drink enough fluid to keep your pee (urine) pale yellow.  Follow instructions from your doctor about what you cannot eat or drink.  Return to your normal diet as told by your doctor. Avoid heavy or fried foods that are hard to digest. Activity  Rest as told by your doctor.  Do not sit for a long time without moving. Get up to take short walks every 1-2 hours. This is important. Ask for help if you feel weak or unsteady.  Return to your normal activities as told by your doctor. Ask your doctor what activities are safe for you. To help cramping and bloating:   Try walking around.  Put heat on your belly as told by your doctor. Use the heat source that your doctor recommends, such as a moist heat pack or a heating pad. ? Put a towel between your skin and the heat source. ? Leave the heat on for 20-30 minutes. ? Remove  the heat if your skin turns bright red. This is very important if you are unable to feel pain, heat, or cold. You may have a greater risk of getting burned. General instructions  For the first 24 hours after the procedure: ? Do not drive or use machinery. ? Do not sign important documents. ? Do not drink alcohol. ? Do your daily activities more slowly than normal. ? Eat foods that are soft and easy to digest.  Take over-the-counter or prescription medicines only as told by your doctor.  Keep all follow-up visits as told by your doctor. This is important. Contact a doctor if:  You have blood in your poop 2-3 days after  the procedure. Get help right away if:  You have more than a small amount of blood in your poop.  You see large clumps of tissue (blood clots) in your poop.  Your belly is swollen.  You feel like you may vomit (nauseous).  You vomit.  You have a fever.  You have belly pain that gets worse, and medicine does not help your pain. Summary  After the procedure, it is common to have a small amount of blood in your poop. You may also have mild cramping and bloating in your belly.  For the first 24 hours after the procedure, do not drive or use machinery, do not sign important documents, and do not drink alcohol.  Get help right away if you have a lot of blood in your poop, feel like you may vomit, have a fever, or have more belly pain. This information is not intended to replace advice given to you by your health care provider. Make sure you discuss any questions you have with your health care provider. Document Revised: 01/09/2019 Document Reviewed: 01/09/2019 Elsevier Patient Education  [place].

## 2020-05-17 LAB — SURGICAL PATHOLOGY

## 2020-05-21 ENCOUNTER — Encounter (HOSPITAL_COMMUNITY): Payer: Self-pay | Admitting: Gastroenterology

## 2020-06-12 ENCOUNTER — Encounter (INDEPENDENT_AMBULATORY_CARE_PROVIDER_SITE_OTHER): Payer: Self-pay | Admitting: *Deleted

## 2020-06-27 DIAGNOSIS — U071 COVID-19: Secondary | ICD-10-CM | POA: Diagnosis not present

## 2020-07-10 DIAGNOSIS — K219 Gastro-esophageal reflux disease without esophagitis: Secondary | ICD-10-CM | POA: Diagnosis not present

## 2020-07-10 DIAGNOSIS — Z1159 Encounter for screening for other viral diseases: Secondary | ICD-10-CM | POA: Diagnosis not present

## 2020-07-10 DIAGNOSIS — Z0001 Encounter for general adult medical examination with abnormal findings: Secondary | ICD-10-CM | POA: Diagnosis not present

## 2020-07-10 DIAGNOSIS — E8881 Metabolic syndrome: Secondary | ICD-10-CM | POA: Diagnosis not present

## 2020-07-10 DIAGNOSIS — E782 Mixed hyperlipidemia: Secondary | ICD-10-CM | POA: Diagnosis not present

## 2020-07-10 DIAGNOSIS — E7849 Other hyperlipidemia: Secondary | ICD-10-CM | POA: Diagnosis not present

## 2020-07-17 DIAGNOSIS — Z6828 Body mass index (BMI) 28.0-28.9, adult: Secondary | ICD-10-CM | POA: Diagnosis not present

## 2020-07-17 DIAGNOSIS — Z0001 Encounter for general adult medical examination with abnormal findings: Secondary | ICD-10-CM | POA: Diagnosis not present

## 2020-07-17 DIAGNOSIS — E8881 Metabolic syndrome: Secondary | ICD-10-CM | POA: Diagnosis not present

## 2020-07-17 DIAGNOSIS — K2100 Gastro-esophageal reflux disease with esophagitis, without bleeding: Secondary | ICD-10-CM | POA: Diagnosis not present

## 2020-07-17 DIAGNOSIS — Z23 Encounter for immunization: Secondary | ICD-10-CM | POA: Diagnosis not present

## 2020-07-17 DIAGNOSIS — E782 Mixed hyperlipidemia: Secondary | ICD-10-CM | POA: Diagnosis not present

## 2020-07-17 DIAGNOSIS — R7301 Impaired fasting glucose: Secondary | ICD-10-CM | POA: Diagnosis not present

## 2020-07-22 DIAGNOSIS — Z23 Encounter for immunization: Secondary | ICD-10-CM | POA: Diagnosis not present

## 2020-10-16 DIAGNOSIS — R509 Fever, unspecified: Secondary | ICD-10-CM | POA: Diagnosis not present

## 2020-10-16 DIAGNOSIS — A084 Viral intestinal infection, unspecified: Secondary | ICD-10-CM | POA: Diagnosis not present

## 2021-03-02 IMAGING — CT CT ABD-PELV W/ CM
2 of 4 series · 16 of 46 positions shown, 18 images · IV contrast (omnipaque)
Comparison: None.

CLINICAL DATA: Diverticulitis.

EXAM:
CT ABDOMEN AND PELVIS WITH CONTRAST
TECHNIQUE: Multidetector CT imaging of the abdomen and pelvis was performed
using the standard protocol following bolus administration of
intravenous contrast.
CONTRAST:  100mL OMNIPAQUE IOHEXOL 300 MG/ML  SOLN

[Series 2: axial st · axial · 0.83mm/px · z∈[+664,+1129]mm · 13 of 103 slices shown, 15 images]
[im 5/103  soft-tissue]
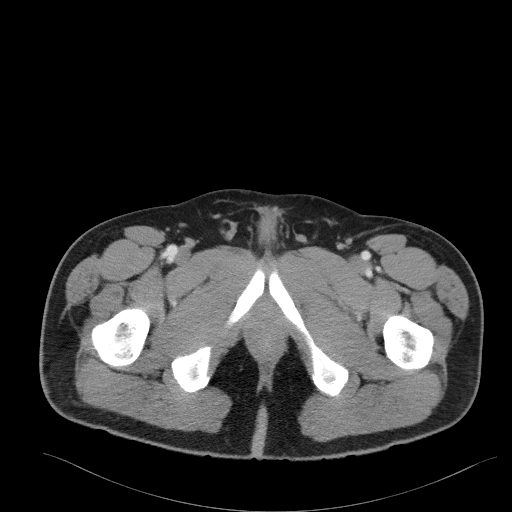
[im 5/103  bone]
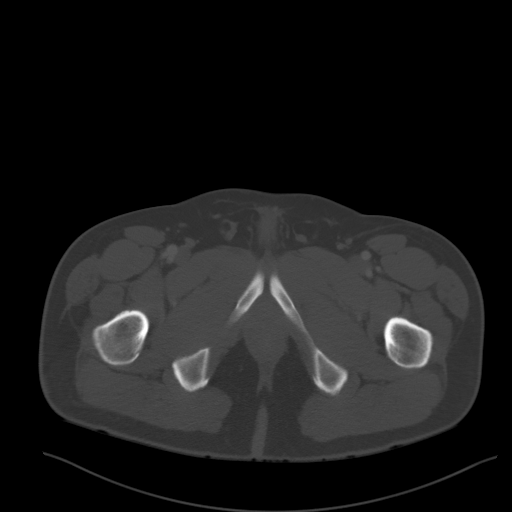
[im 14/103  soft-tissue]
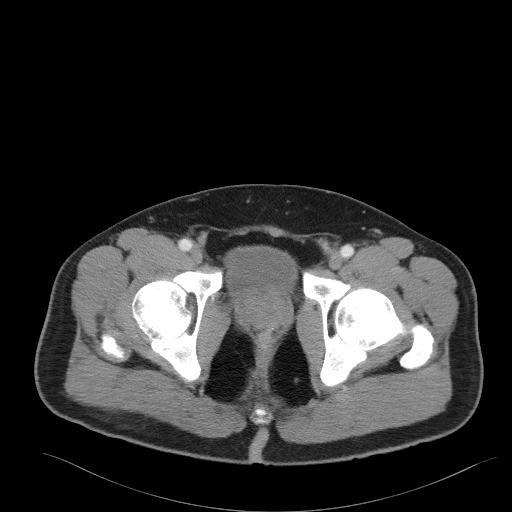
[im 24/103  soft-tissue]
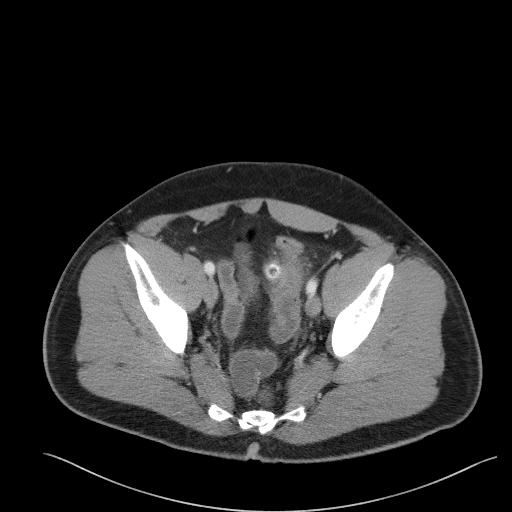
[im 28/103  soft-tissue]
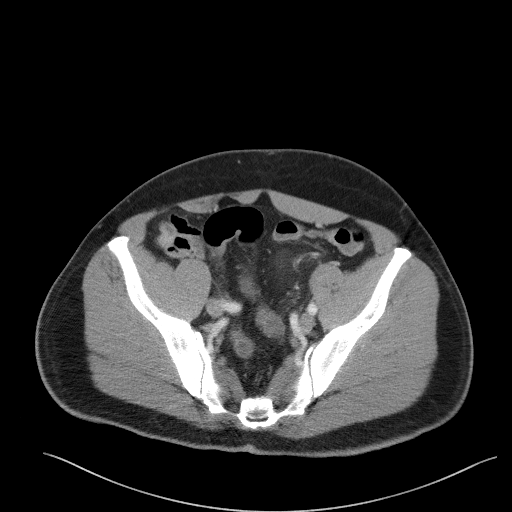
[im 38/103  soft-tissue]
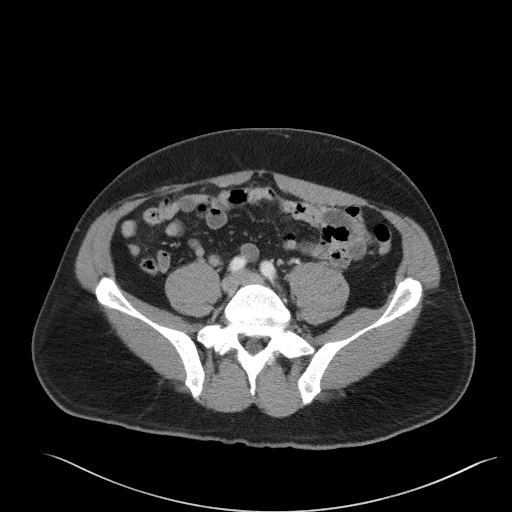
[im 42/103  soft-tissue]
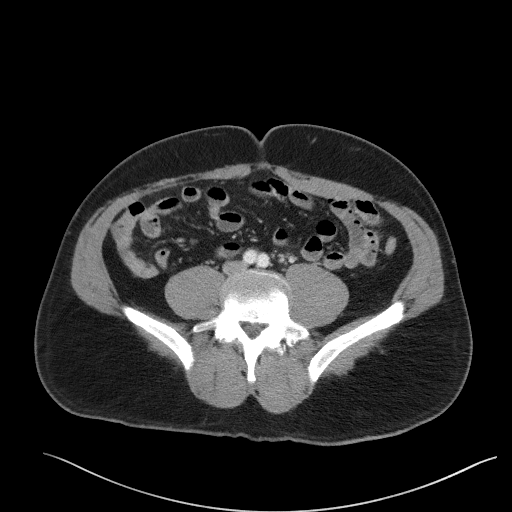
[im 52/103  soft-tissue]
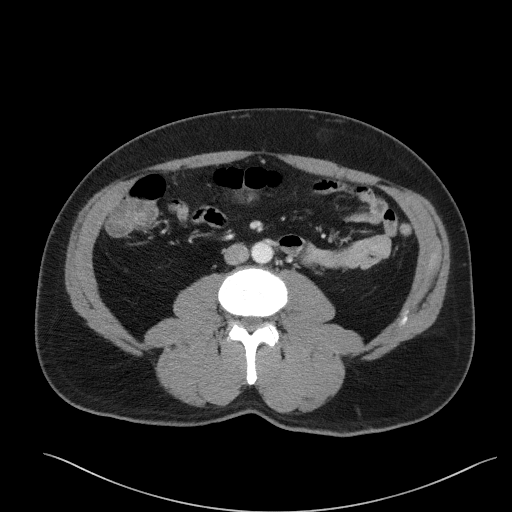
[im 61/103  soft-tissue]
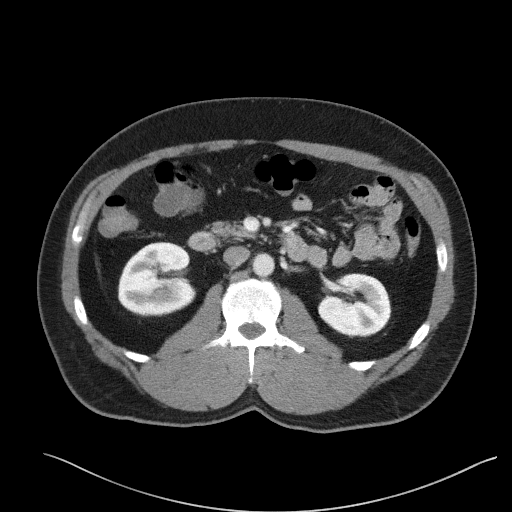
[im 65/103  soft-tissue]
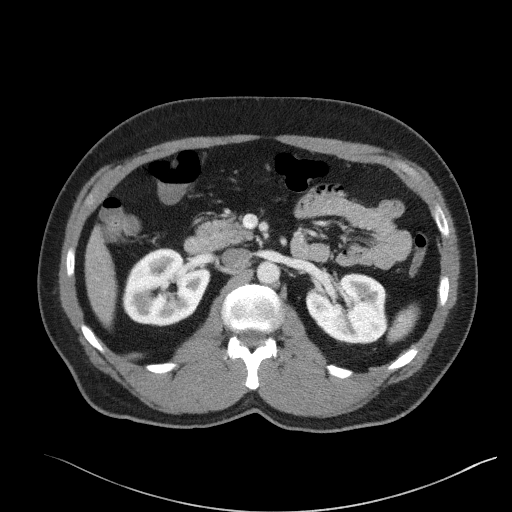
[im 65/103  bone]
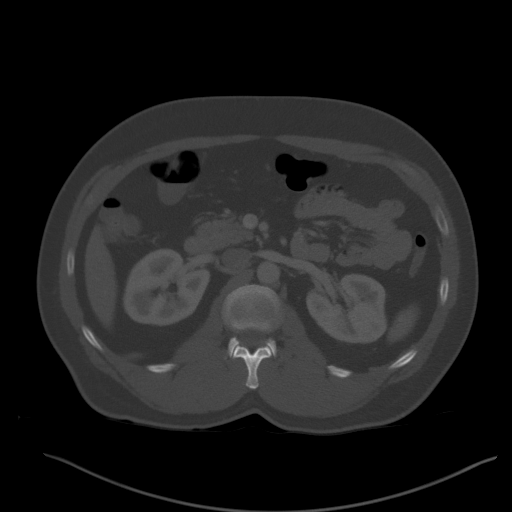
[im 75/103  soft-tissue]
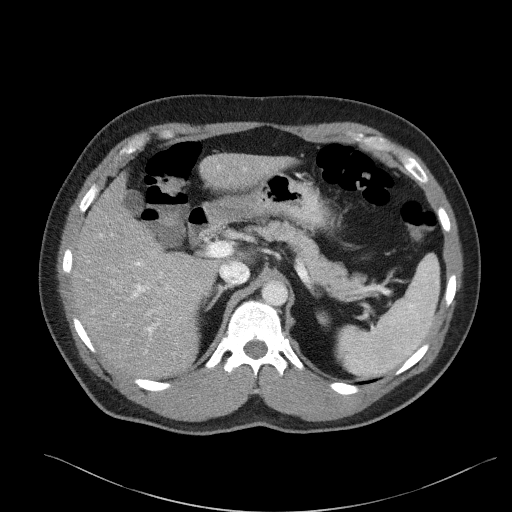
[im 79/103  soft-tissue]
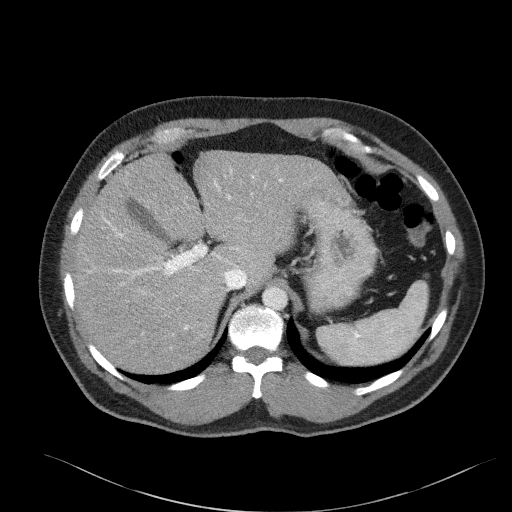
[im 89/103  soft-tissue]
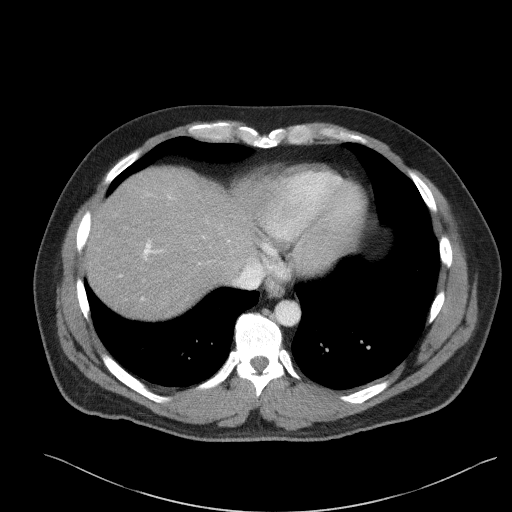
[im 98/103  soft-tissue]
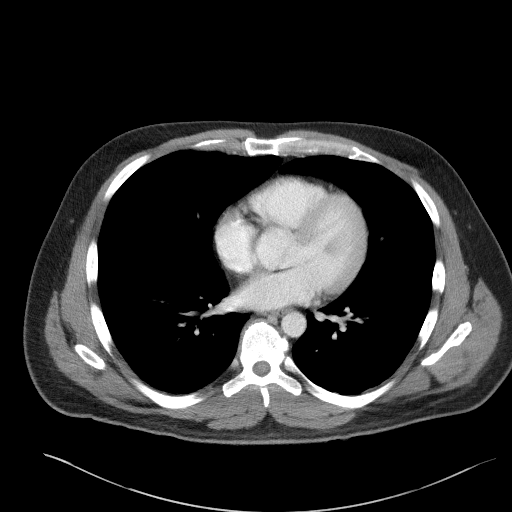

[Series 5: coronal st · coronal · 0.90mm/px · 3 of 86 slices shown]
[im 29/86  soft-tissue]
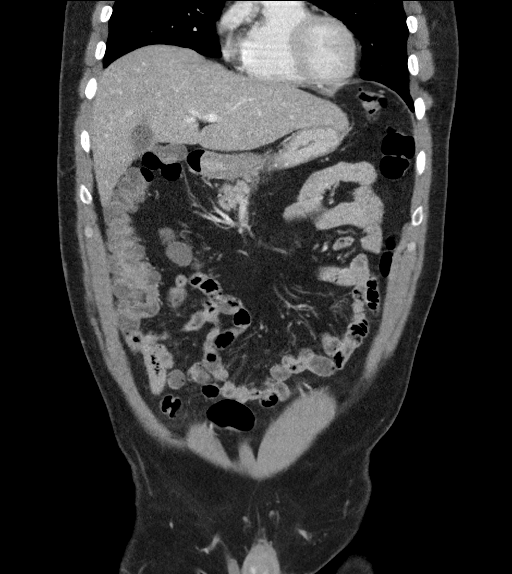
[im 38/86  soft-tissue]
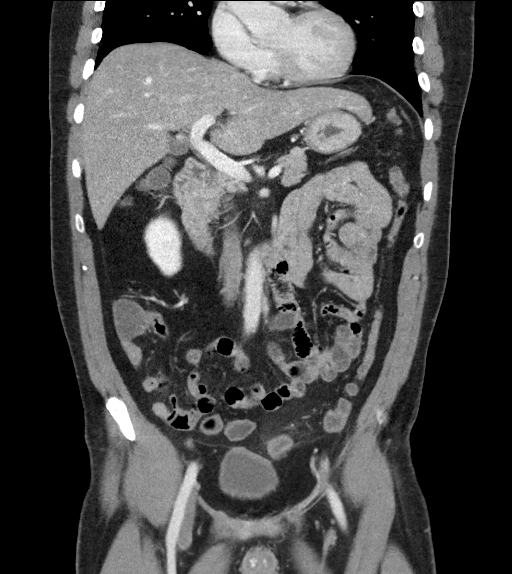
[im 48/86  soft-tissue]
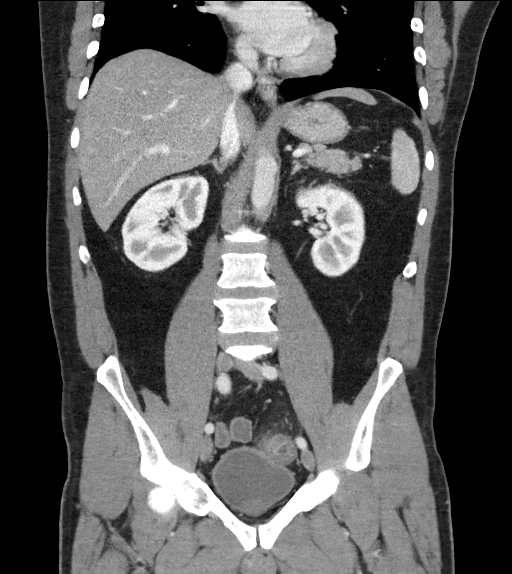

[16 of 46 positions shown; findings below may reference images not displayed]

FINDINGS: Lower chest: The lung bases are clear. The heart size is normal.

Hepatobiliary: There is decreased hepatic attenuation suggestive of
hepatic steatosis. Normal gallbladder.There is no biliary ductal
dilation.

Pancreas: Normal contours without ductal dilatation. No
peripancreatic fluid collection.

Spleen: Unremarkable.

Adrenals/Urinary Tract:

--Adrenal glands: Unremarkable.

--Right kidney/ureter: No hydronephrosis or radiopaque kidney
stones.

--Left kidney/ureter: No hydronephrosis or radiopaque kidney stones.

--Urinary bladder: Unremarkable.

Stomach/Bowel:

--Stomach/Duodenum: No hiatal hernia or other gastric abnormality.
Normal duodenal course and caliber.

--Small bowel: Unremarkable.

--Colon: There is a single diverticulum at the level of the sigmoid
colon with surrounding inflammatory changes and adjacent
circumferential wall thickening of the affected sigmoid colon. There
is no adjacent fluid collection or free air. There is liquid stool
within the right hemicolon.

--Appendix: Not visualized. No right lower quadrant inflammation or
free fluid.

Vascular/Lymphatic: Normal course and caliber of the major abdominal
vessels.

--No retroperitoneal lymphadenopathy.

--No mesenteric lymphadenopathy.

--No pelvic or inguinal lymphadenopathy.

Reproductive: Unremarkable

Other: No ascites or free air. The abdominal wall is normal.

Musculoskeletal. No acute displaced fractures.
IMPRESSION: 1. Acute uncomplicated sigmoid diverticulitis.
2. Hepatic steatosis.

## 2021-04-09 DIAGNOSIS — L570 Actinic keratosis: Secondary | ICD-10-CM | POA: Diagnosis not present

## 2021-04-09 DIAGNOSIS — D239 Other benign neoplasm of skin, unspecified: Secondary | ICD-10-CM | POA: Diagnosis not present

## 2021-04-09 DIAGNOSIS — Z1283 Encounter for screening for malignant neoplasm of skin: Secondary | ICD-10-CM | POA: Diagnosis not present

## 2022-01-22 DIAGNOSIS — R7301 Impaired fasting glucose: Secondary | ICD-10-CM | POA: Diagnosis not present

## 2022-01-22 DIAGNOSIS — E8881 Metabolic syndrome: Secondary | ICD-10-CM | POA: Diagnosis not present

## 2022-01-22 DIAGNOSIS — Z0001 Encounter for general adult medical examination with abnormal findings: Secondary | ICD-10-CM | POA: Diagnosis not present

## 2022-01-22 DIAGNOSIS — E7849 Other hyperlipidemia: Secondary | ICD-10-CM | POA: Diagnosis not present

## 2022-01-26 DIAGNOSIS — Z6827 Body mass index (BMI) 27.0-27.9, adult: Secondary | ICD-10-CM | POA: Diagnosis not present

## 2022-01-26 DIAGNOSIS — K2100 Gastro-esophageal reflux disease with esophagitis, without bleeding: Secondary | ICD-10-CM | POA: Diagnosis not present

## 2022-01-26 DIAGNOSIS — E7849 Other hyperlipidemia: Secondary | ICD-10-CM | POA: Diagnosis not present

## 2022-01-26 DIAGNOSIS — R7301 Impaired fasting glucose: Secondary | ICD-10-CM | POA: Diagnosis not present

## 2022-01-26 DIAGNOSIS — E8881 Metabolic syndrome: Secondary | ICD-10-CM | POA: Diagnosis not present

## 2022-01-26 DIAGNOSIS — Z0001 Encounter for general adult medical examination with abnormal findings: Secondary | ICD-10-CM | POA: Diagnosis not present

## 2022-01-26 DIAGNOSIS — K5792 Diverticulitis of intestine, part unspecified, without perforation or abscess without bleeding: Secondary | ICD-10-CM | POA: Diagnosis not present

## 2022-04-08 DIAGNOSIS — L570 Actinic keratosis: Secondary | ICD-10-CM | POA: Diagnosis not present

## 2022-07-03 DIAGNOSIS — E7849 Other hyperlipidemia: Secondary | ICD-10-CM | POA: Diagnosis not present

## 2022-07-03 DIAGNOSIS — R7301 Impaired fasting glucose: Secondary | ICD-10-CM | POA: Diagnosis not present

## 2022-07-03 DIAGNOSIS — K219 Gastro-esophageal reflux disease without esophagitis: Secondary | ICD-10-CM | POA: Diagnosis not present

## 2022-07-03 DIAGNOSIS — E8881 Metabolic syndrome: Secondary | ICD-10-CM | POA: Diagnosis not present

## 2022-07-03 DIAGNOSIS — Z1329 Encounter for screening for other suspected endocrine disorder: Secondary | ICD-10-CM | POA: Diagnosis not present

## 2023-01-28 DIAGNOSIS — R7301 Impaired fasting glucose: Secondary | ICD-10-CM | POA: Diagnosis not present

## 2023-01-28 DIAGNOSIS — E7849 Other hyperlipidemia: Secondary | ICD-10-CM | POA: Diagnosis not present

## 2023-01-28 DIAGNOSIS — Z1329 Encounter for screening for other suspected endocrine disorder: Secondary | ICD-10-CM | POA: Diagnosis not present

## 2023-01-28 DIAGNOSIS — Z125 Encounter for screening for malignant neoplasm of prostate: Secondary | ICD-10-CM | POA: Diagnosis not present

## 2023-01-28 DIAGNOSIS — Z0001 Encounter for general adult medical examination with abnormal findings: Secondary | ICD-10-CM | POA: Diagnosis not present

## 2023-02-04 ENCOUNTER — Other Ambulatory Visit (HOSPITAL_COMMUNITY): Payer: Self-pay | Admitting: Family Medicine

## 2023-02-04 DIAGNOSIS — R7989 Other specified abnormal findings of blood chemistry: Secondary | ICD-10-CM

## 2023-02-04 DIAGNOSIS — R7301 Impaired fasting glucose: Secondary | ICD-10-CM | POA: Diagnosis not present

## 2023-02-04 DIAGNOSIS — Z23 Encounter for immunization: Secondary | ICD-10-CM | POA: Diagnosis not present

## 2023-02-04 DIAGNOSIS — Z0001 Encounter for general adult medical examination with abnormal findings: Secondary | ICD-10-CM | POA: Diagnosis not present

## 2023-02-04 DIAGNOSIS — R030 Elevated blood-pressure reading, without diagnosis of hypertension: Secondary | ICD-10-CM | POA: Diagnosis not present

## 2023-02-04 DIAGNOSIS — Z6828 Body mass index (BMI) 28.0-28.9, adult: Secondary | ICD-10-CM | POA: Diagnosis not present

## 2023-02-04 DIAGNOSIS — E8881 Metabolic syndrome: Secondary | ICD-10-CM | POA: Diagnosis not present

## 2023-02-04 DIAGNOSIS — K760 Fatty (change of) liver, not elsewhere classified: Secondary | ICD-10-CM | POA: Diagnosis not present

## 2023-02-04 DIAGNOSIS — E7849 Other hyperlipidemia: Secondary | ICD-10-CM | POA: Diagnosis not present

## 2023-02-04 DIAGNOSIS — K2100 Gastro-esophageal reflux disease with esophagitis, without bleeding: Secondary | ICD-10-CM | POA: Diagnosis not present

## 2023-02-16 ENCOUNTER — Ambulatory Visit (HOSPITAL_COMMUNITY)
Admission: RE | Admit: 2023-02-16 | Discharge: 2023-02-16 | Disposition: A | Discharge status: Home / Self Care | Payer: 59 | Source: Ambulatory Visit | Attending: Family Medicine | Admitting: Family Medicine

## 2023-02-16 DIAGNOSIS — R7989 Other specified abnormal findings of blood chemistry: Secondary | ICD-10-CM | POA: Diagnosis not present

## 2023-04-22 DIAGNOSIS — Z23 Encounter for immunization: Secondary | ICD-10-CM | POA: Diagnosis not present

## 2023-06-03 DIAGNOSIS — R109 Unspecified abdominal pain: Secondary | ICD-10-CM | POA: Diagnosis not present

## 2023-09-20 DIAGNOSIS — I10 Essential (primary) hypertension: Secondary | ICD-10-CM | POA: Diagnosis not present

## 2023-09-20 DIAGNOSIS — R748 Abnormal levels of other serum enzymes: Secondary | ICD-10-CM | POA: Diagnosis not present

## 2023-10-01 DIAGNOSIS — H6123 Impacted cerumen, bilateral: Secondary | ICD-10-CM | POA: Diagnosis not present

## 2023-10-01 DIAGNOSIS — H90A21 Sensorineural hearing loss, unilateral, right ear, with restricted hearing on the contralateral side: Secondary | ICD-10-CM | POA: Diagnosis not present

## 2024-03-22 DIAGNOSIS — L814 Other melanin hyperpigmentation: Secondary | ICD-10-CM | POA: Diagnosis not present

## 2024-03-22 DIAGNOSIS — L573 Poikiloderma of Civatte: Secondary | ICD-10-CM | POA: Diagnosis not present

## 2024-03-22 DIAGNOSIS — L570 Actinic keratosis: Secondary | ICD-10-CM | POA: Diagnosis not present
